# Patient Record
Sex: Female | Born: 1970
Health system: Southern US, Community
[De-identification: ages and names within clinical notes are randomized; demographics above are authoritative.]

## PROBLEM LIST (undated history)

## (undated) HISTORY — PX: SHOULDER SURGERY: SHX246

## (undated) HISTORY — PX: ABDOMINAL HYSTERECTOMY: SHX81

## (undated) HISTORY — PX: BACK SURGERY: SHX140

---

## 2003-09-12 ENCOUNTER — Emergency Department (HOSPITAL_COMMUNITY): Admission: EM | Admit: 2003-09-12 | Discharge: 2003-09-13 | Payer: Self-pay | Admitting: Emergency Medicine

## 2004-11-07 ENCOUNTER — Ambulatory Visit (HOSPITAL_BASED_OUTPATIENT_CLINIC_OR_DEPARTMENT_OTHER): Admission: RE | Admit: 2004-11-07 | Discharge: 2004-11-07 | Payer: Self-pay | Admitting: Orthopedic Surgery

## 2005-01-23 ENCOUNTER — Ambulatory Visit (HOSPITAL_BASED_OUTPATIENT_CLINIC_OR_DEPARTMENT_OTHER): Admission: RE | Admit: 2005-01-23 | Discharge: 2005-01-23 | Payer: Self-pay | Admitting: Orthopedic Surgery

## 2007-01-15 ENCOUNTER — Ambulatory Visit (HOSPITAL_COMMUNITY): Admission: RE | Admit: 2007-01-15 | Discharge: 2007-01-16 | Payer: Self-pay | Admitting: Orthopaedic Surgery

## 2007-08-09 IMAGING — RF DG LUMBAR SPINE 1V
1 series · 1 of 1 positions shown · non-contrast
Comparison: none

CLINICAL DATA: Left L3 for microdiscectomy.

Lateral intraoperative fluoroscopic spot image of the lumbar spine:

[Series 1: run · 1 of 1 slices shown]
[im 1/1]
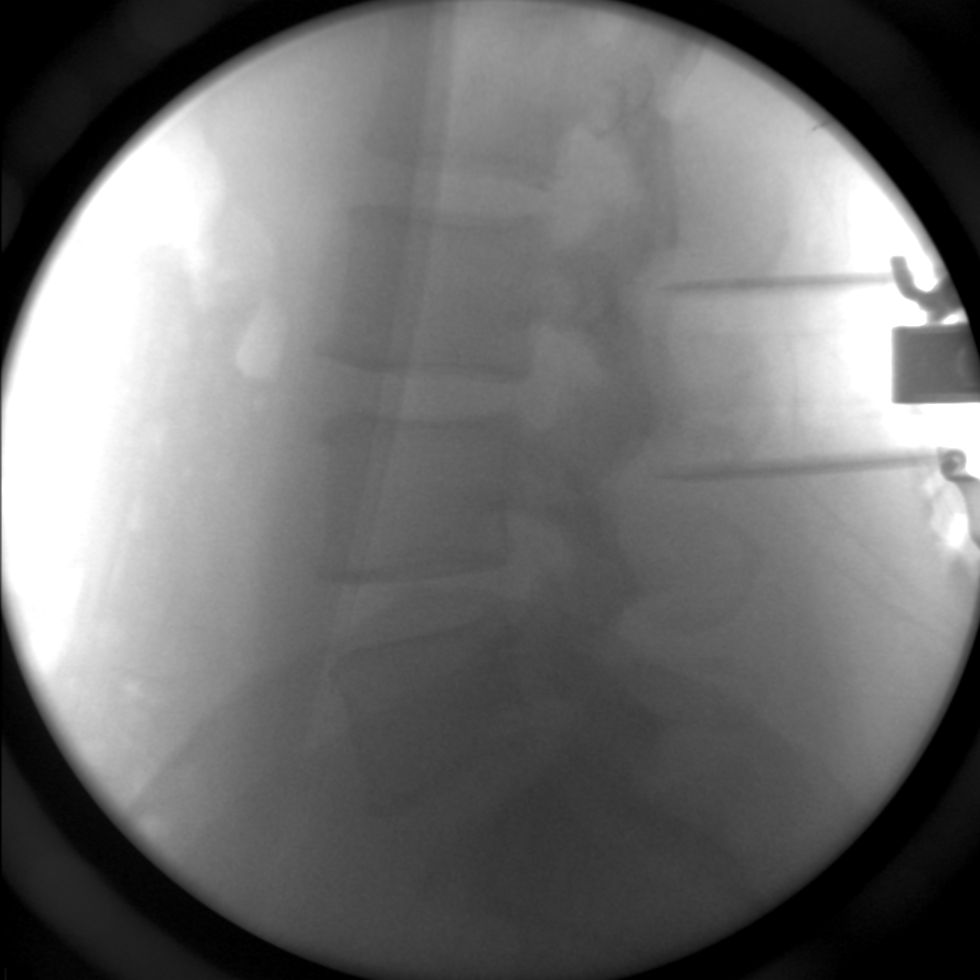

[1 of 1 positions shown; findings below may reference images not displayed]

FINDINGS: Assuming that the lowest fully segmental lumbar type nonrib-bearing
vertebra is L5, there appear to be linear probes between the L2-L3 and L3-L4
spinous processes.

Impressions:
1. Intraoperative lumbar localization.

## 2010-09-19 ENCOUNTER — Inpatient Hospital Stay (HOSPITAL_COMMUNITY)
Admission: AD | Admit: 2010-09-19 | Discharge: 2010-09-19 | Payer: Self-pay | Source: Home / Self Care | Admitting: Obstetrics & Gynecology

## 2010-10-18 ENCOUNTER — Ambulatory Visit: Payer: Self-pay | Admitting: Obstetrics and Gynecology

## 2010-10-24 ENCOUNTER — Ambulatory Visit (HOSPITAL_COMMUNITY): Admission: RE | Admit: 2010-10-24 | Payer: Self-pay | Source: Home / Self Care | Admitting: Family Medicine

## 2011-01-03 LAB — URINALYSIS, ROUTINE W REFLEX MICROSCOPIC
Bilirubin Urine: NEGATIVE
Glucose, UA: NEGATIVE mg/dL
Ketones, ur: NEGATIVE mg/dL
Nitrite: NEGATIVE
Protein, ur: NEGATIVE mg/dL
Specific Gravity, Urine: 1.025 (ref 1.005–1.030)
Urobilinogen, UA: 0.2 mg/dL (ref 0.0–1.0)
pH: 6 (ref 5.0–8.0)

## 2011-01-03 LAB — POCT PREGNANCY, URINE: Preg Test, Ur: NEGATIVE

## 2011-01-03 LAB — URINE MICROSCOPIC-ADD ON

## 2011-03-09 NOTE — Op Note (Signed)
Sherry Turner, SCOBEE NO.:  [id number]   MEDICAL RECORD NO.:  [id number]          PATIENT TYPE:  AMB   LOCATION:  DSC                          FACILITY:  [facility]   PHYSICIAN:  Cindee Salt, M.D.       DATE OF BIRTH:  13-Jun-1971   DATE OF PROCEDURE:  11/07/2004  DATE OF DISCHARGE:                                 OPERATIVE REPORT   PREOPERATIVE DIAGNOSIS:  Carpal tunnel syndrome, left hand.   POSTOPERATIVE DIAGNOSIS:  Carpal tunnel syndrome, left hand.   OPERATION:  Decompression left median nerve.   SURGEON:  Cindee Salt, M.D.   ASSISTANTCarolyne Fiscal.   ANESTHESIA:  Forearm-based IV regional.   HISTORY:  The patient is a 40 year old female with a history of carpal  tunnel syndrome and EMG nerve conductions positive, which has not responded  to conservative treatment.   PROCEDURE:  The patient is brought to the operating room, where a forearm-  based IV regional anesthetic was carried out without difficulty.  She was  prepped using DuraPrep, supine position, left arm free.  A longitudinal  incision was made in the palm, carried down through subcutaneous tissue.  Bleeders were electrocauterized.  Palmar fascia was split, superficial  palmar arch identified, the flexor tendon to the removal finger identified  to the ulnar side of the median nerve.  The carpal retinaculum was incised  with sharp dissection, right angle and Sewell retractor placed between the  skin and forearm fascia. The fascia was released for approximately 1.5 cm  proximal to the wrist crease under direct vision.  The canal was explored.  Tenosynovial synovial tissue was thickened.  The area of compression of the  nerve was apparent with an area of hyperemia.  The wound was irrigated.  Skin was closed with interrupted 5-0 nylon sutures.  A sterile compressive  dressing and splint was applied.  The patient tolerated the procedure well  was taken to the recovery room for observation in satisfactory  condition.  She is discharged home to return to the [facility] [place] in one  week on Vicodin.       GK/MEDQ  D:  11/07/2004  T:  11/07/2004  Job:  [id number]

## 2011-03-09 NOTE — H&P (Signed)
Sherry Turner, Sherry Turner NO.:  [id number]   MEDICAL RECORD NO.:  [id number]          PATIENT TYPE:  OIB   LOCATION:  5006                         FACILITY:  [facility]   PHYSICIAN:  Verlin Fester, P.A.    DATE OF BIRTH:  1970-10-27   DATE OF ADMISSION:  01/15/2007  DATE OF DISCHARGE:  01/16/2007                              HISTORY & PHYSICAL   CHIEF COMPLAINT:  Low back and left lower extremity pain.   HISTORY OF PRESENT ILLNESS:  The patient is a 40 year old female who has  had a sudden onset of low back pain that radiated into her left lower  extremity.  She was found to have an HNP at L3-4.  Secondary to this,  and her failure to improve conservatively, it was thought her best  course of management would be a microdiskectomy and left-sided L3-4.  Risks and benefits of the surgery were discussed with the patient by Dr.  Noel Gerold, as well as myself.  She indicated understanding and opted to  proceed.   ALLERGIES:  VICODIN CAUSES ITCHING.   MEDICATIONS:  1. Ibuprofen 800 mg.  2. Birth control pill.   PAST MEDICAL HISTORY:  Healthy.   PAST SURGICAL HISTORY:  Carpal tunnel release.   SOCIAL HISTORY:  The patient is married, has 2 children, ages 50 and 64.  Denies tobacco use.  Denies alcohol use.   FAMILY MEDICAL HISTORY:  Noncontributory.   REVIEW OF SYSTEMS:  Negative.   PHYSICAL EXAMINATION:  GENERAL APPEARANCE:  The patient is a 40 year old  black female who is alert and oriented in no acute distress.  She is  well-nourished, well-groomed.  Appears stated age.  Pleasant and  cooperative to exam.  HEENT:  Head is normocephalic, atraumatic.  Pupils equal, round,  reactive to light.  Extraocular movements are intact.  Nares patent.  Pharynx clear.  NECK:  Soft to palpation.  No lymphadenopathy or thyromegaly  appreciated.  CHEST:  Clear to auscultation bilaterally.  No rales, rhonchi, stridor,  wheezes, or friction rubs.  BREASTS:  Not pertinent, not  performed.  HEART:  S1 and S2, regular rate and rhythm, no murmurs, gallops, or rubs  noted.  ABDOMEN:  Soft to palpation, nontender, nondistended, no organomegaly  noted.  Positive bowel sounds throughout.  GU:  Not pertinent, not performed.  EXTREMITIES:  As per HPI.  SKIN:  Intact without any lesions or rashes.   IMPRESSION:  Herniated nucleus pulposus L3-4 on the left.   PLAN:  Admit to [facility] on January 15, 2007, for a left-sided  L3-4 microdiskectomy.  Surgery will be done by Dr. Noel Gerold.      Verlin Fester, P.A.     CM/MEDQ  D:  01/29/2007  T:  01/29/2007  Job:  [id number]

## 2011-03-09 NOTE — Op Note (Signed)
[name], [name] NO.:  [id number]   MEDICAL RECORD NO.:  [id number]          PATIENT TYPE:  AMB   LOCATION:  DSC                          FACILITY:  [facility]   PHYSICIAN:  Cindee Salt, M.D.       DATE OF BIRTH:  12/15/1970   DATE OF PROCEDURE:  01/23/2005  DATE OF DISCHARGE:                                 OPERATIVE REPORT   PREOPERATIVE DIAGNOSIS:  Carpal tunnel syndrome right hand.   POSTOPERATIVE DIAGNOSIS:  Carpal tunnel syndrome right hand.   OPERATION:  Decompression right median nerve.   SURGEON:  Cindee Salt, M.D.   ASSISTANT:  Imam.   ANESTHESIA PERFORMED:  Basal IV regional.   HISTORY:  The patient is a 40 year old female with history of carpal tunnel  syndrome.  EMG nerve conduction was positive which has not responded to  conservative treatment.   PROCEDURE:  The patient was brought to the operating room where a forearm  based IV regional anesthetic was carried out without difficulty. She was  prepped using DuraPrep in the supine position, right arm free. Longitudinal  incision was made, in the palm and carried down through subcutaneous tissue.  Bleeders were electrocauterized. Palmar fascia was split, superficial palmar  arch identified.  The flexor tendon in the ring and little finger  identified.  To the ulnar side of median nerve the carpal retinaculum was  incised with sharp dissection. A right angle and Sewell retractor placed  between skin and forearm fascia. Fascia was released for approximately 1.5  cm proximal to the wrist crease under direct vision.  Canal was explored. No  further lesions were identified and an area of compression to the nerve  apparent.  The wound was irrigated. The skin was closed with interrupted 5-0  nylon sutures. Sterile compressive dressing and splint to the hand-wrist was  applied. The patient tolerated the procedure well and was taken to the  recovery room for observation, in satisfactory condition. She is  discharged  to home to return to the [facility] [place] in 1 week on Talwin NX.     GK/MEDQ  D:  01/23/2005  T:  01/23/2005  Job:  [id number]   cc:   Cindee Salt, M.D.  [address]  [place]  [place] [address]  Fax: [phone]

## 2011-03-09 NOTE — Op Note (Signed)
NAME:  Sherry Turner, Sherry Turner NO.:  [id number]   MEDICAL RECORD NO.:  [id number]          PATIENT TYPE:  OIB   LOCATION:  5006                         FACILITY:  [facility]   PHYSICIAN:  Sharolyn Douglas, M.D.        DATE OF BIRTH:  Nov 29, 1970   DATE OF PROCEDURE:  01/16/2007  DATE OF DISCHARGE:                               OPERATIVE REPORT   DIAGNOSIS:  Lateral recess stenosis and herniated nucleus pulposus left  L3-4.   PROCEDURE:  Left L3-4 microlaminotomy and diskectomy.   SURGEON:  Sharolyn Douglas, M.D.   ASSISTANT:  Jill Side Mahar, PA   ANESTHESIA:  General endotracheal   ESTIMATED BLOOD LOSS:  Minimal.   COMPLICATIONS:  None.   INDICATIONS:  The patient is a  pleasant female with persistent back and  left lower extremity pain thought to be secondary to disk herniation and  lateral recess narrowing at L3-4 on the left.  She has failed other  conservative treatments and now presents for L3-4 micro decompression.  The risks, benefits, and alternatives were reviewed.   PROCEDURE:  After informed consent she was taken to the operating room  where she underwent general endotracheal anesthesia without difficulty.  She was given prophylactic IV antibiotics.  Carefully positioned prone  on the Wilson frame.  All bony prominences padded.  Face and eyes  protected at all times.  Back prepped and draped in the usual sterile  fashion.  Fluoroscopy was brought into the field.  The L3-4 level was  identified.  A 2-cm incision was made in the midline.  Dissection was  carried through the deep fascia sharply.  Dilators were utilized from  the [employer] retractor system to dock onto the L3-4 interspace on the  left which was confirmed with fluoroscopy.  The MaXcess retractor was  placed over the final dilator with the lid save 2 mm blade and attached  to the operating room table using the attachment arm.  The retractor was  gently spread dilating the paraspinal muscles.  Further  fluoroscopic  imaging confirmed the retractor placed over the L3-4 interspace.  We  then brought the microscope into the operating room field.  High-speed  bur was used to remove the inferior one third of the L3 lamina, medial  one third of the facet joint complex.  The ligamentum flavum was removed  piecemeal.  The L4 nerve root was identified, mobilized medial.  A focal  disk herniation was identified which was displacing the nerve root.  The  disk space was entered using a 15 blade, and a subligamentous disk  herniation was removed which immediately decompressed the nerve root.  The spinal canal was explored, and there did not appear to be any  further fragments.  Several passes were made with the pituitary,  removing additional loose fragments from within the interspace.  The  wound was irrigated.  Hemostasis was achieved.  Two mL of fentanyl left  in the exposed epidural space for postoperative analgesia.  The superior  facet was undercut further decompressing the lateral recess.  The deep  fascia was  closed with 0 Vicryl suture.  Subcutaneous layer was closed  with 2-0 Vicryl suture followed by a running 3-0 subcuticular Vicryl  suture on the skin edges.  Dermabond was applied.  The patient was  turned supine, extubated without difficulty, and transferred to recovery  in stable condition, able to move her upper and lower extremities.   It should be noted my assistant, Verlin Fester, PA was present  throughout the procedure including positioning, exposure, decompression,  and she also assisted with wound closure.      Sharolyn Douglas, M.D.  Electronically Signed     MC/MEDQ  D:  01/16/2007  T:  01/16/2007  Job:  [id number]

## 2016-02-22 ENCOUNTER — Emergency Department (HOSPITAL_BASED_OUTPATIENT_CLINIC_OR_DEPARTMENT_OTHER)
Admission: EM | Admit: 2016-02-22 | Discharge: 2016-02-22 | Disposition: A | Discharge status: Home / Self Care | Payer: BC Managed Care – PPO | Attending: Emergency Medicine | Admitting: Emergency Medicine

## 2016-02-22 ENCOUNTER — Encounter (HOSPITAL_BASED_OUTPATIENT_CLINIC_OR_DEPARTMENT_OTHER): Payer: Self-pay

## 2016-02-22 DIAGNOSIS — R509 Fever, unspecified: Secondary | ICD-10-CM | POA: Diagnosis not present

## 2016-02-22 DIAGNOSIS — R0981 Nasal congestion: Secondary | ICD-10-CM | POA: Diagnosis not present

## 2016-02-22 DIAGNOSIS — J111 Influenza due to unidentified influenza virus with other respiratory manifestations: Secondary | ICD-10-CM

## 2016-02-22 DIAGNOSIS — R05 Cough: Secondary | ICD-10-CM | POA: Diagnosis not present

## 2016-02-22 DIAGNOSIS — R51 Headache: Secondary | ICD-10-CM | POA: Insufficient documentation

## 2016-02-22 DIAGNOSIS — R69 Illness, unspecified: Secondary | ICD-10-CM

## 2016-02-22 DIAGNOSIS — R52 Pain, unspecified: Secondary | ICD-10-CM | POA: Diagnosis present

## 2016-02-22 MED ORDER — FLUTICASONE PROPIONATE 50 MCG/ACT NA SUSP: 2.0000 | Freq: Every day | NASAL | Status: AC

## 2016-02-22 MED ORDER — CETIRIZINE HCL 10 MG PO TABS: 10.0000 mg | ORAL_TABLET | Freq: Every day | ORAL | Status: AC

## 2016-02-22 MED ORDER — IBUPROFEN 800 MG PO TABS
800.0000 mg | ORAL_TABLET | Freq: Once | ORAL | Status: AC
  Administered 2016-02-22: 800 mg via ORAL
  Filled 2016-02-22: qty 1

## 2016-02-22 MED ORDER — NAPROXEN 250 MG PO TABS: 250.0000 mg | ORAL_TABLET | Freq: Two times a day (BID) | ORAL | Status: AC

## 2016-02-22 MED ORDER — BENZONATATE 100 MG PO CAPS: 100.0000 mg | ORAL_CAPSULE | Freq: Three times a day (TID) | ORAL | Status: AC | PRN

## 2016-02-22 MED FILL — NAPROXEN 250 MG TABLET: 250 | 15 days supply | Qty: 30 | Fill #0

## 2016-02-22 MED FILL — ALL DAY ALLERGY 10 MG TAB: 10 | 100 days supply | Qty: 100 | Fill #0

## 2016-02-22 MED FILL — FLUTICASONE PROP 50 MCG SPR: 50 | 30 days supply | Qty: 16 | Fill #0

## 2016-02-22 MED FILL — BENZONATATE 100 MG CAPSULE: 100 | 7 days supply | Qty: 21 | Fill #0

## 2016-02-22 NOTE — ED Provider Notes (Signed)
CSN: [id number]     Arrival date & time 02/22/16  1154 History   First MD Initiated Contact with Patient 02/22/16 1217     Chief Complaint  Patient presents with  . Generalized Body Aches   Sherry Turner is a 45 y.o. female who presents to the emergency department complaining of sneezing, nasal congestion, postnasal drip, cough, subjective fever and body aches since yesterday. Patient reports subjective fever at home. She is taking Alka-Seltzer with little relief. No antipyretics today. She did not take her flu shot this year. She also complains of generalized headache. No neck pain or neck stiffness. She denies shortness of breath, wheezing, abdominal pain, nausea, vomiting, diarrhea, rashes or urinary symptoms.  The history is provided by the patient. No language interpreter was used.    History reviewed. No pertinent past medical history. Past Surgical History  Procedure Laterality Date  . Shoulder surgery    . Back surgery    . Abdominal hysterectomy     No family history on file. Social History  Substance Use Topics  . Smoking status: Never Smoker   . Smokeless tobacco: None  . Alcohol Use: No   OB History    No data available     Review of Systems  Constitutional: Positive for fever and fatigue.  HENT: Positive for congestion, postnasal drip, rhinorrhea, sinus pressure and sneezing. Negative for ear pain, facial swelling, mouth sores and trouble swallowing.   Eyes: Negative for visual disturbance.  Respiratory: Positive for cough. Negative for chest tightness, shortness of breath and wheezing.   Cardiovascular: Negative for chest pain.  Gastrointestinal: Negative for nausea, vomiting, abdominal pain and diarrhea.  Genitourinary: Negative for dysuria, urgency, frequency and hematuria.  Musculoskeletal: Positive for myalgias. Negative for back pain, neck pain and neck stiffness.  Skin: Negative for rash.  Neurological: Positive for headaches. Negative for dizziness, syncope,  weakness, light-headedness and numbness.      Allergies  Vicodin  Home Medications   Prior to Admission medications   Medication Sig Start Date End Date Taking? Authorizing Provider  benzonatate (TESSALON) 100 MG capsule Take 1 capsule (100 mg total) by mouth 3 (three) times daily as needed for cough. 02/22/16   Everlene Farrier, PA-C  cetirizine (ZYRTEC ALLERGY) 10 MG tablet Take 1 tablet (10 mg total) by mouth daily. 02/22/16   Everlene Farrier, PA-C  fluticasone (FLONASE) 50 MCG/ACT nasal spray Place 2 sprays into both nostrils daily. 02/22/16   Everlene Farrier, PA-C  naproxen (NAPROSYN) 250 MG tablet Take 1 tablet (250 mg total) by mouth 2 (two) times daily with a meal. 02/22/16   Everlene Farrier, PA-C   BP 101/71 mmHg  Pulse 63  Temp(Src) 99 F (37.2 C) (Oral)  Resp 16  Ht  (1.6 m)  Wt 77.111 kg  BMI 30.12 kg/m2  SpO2 100% Physical Exam  Constitutional: She appears well-developed and well-nourished. No distress.  Nontoxic-appearing.  HENT:  Head: Normocephalic and atraumatic.  Right Ear: External ear normal.  Left Ear: External ear normal.  Mouth/Throat: Oropharynx is clear and moist.  Mild bilateral tonsillar hypertrophy without exudates. Uvula is midline without edema. No peritonsillar abscess. No trismus. No drooling. Tongue protrusion is normal.  Bilateral tympanic membranes are pearly-gray without erythema or loss of landmarks.  Boggy nasal turbinates bilaterally.  Eyes: Conjunctivae are normal. Pupils are equal, round, and reactive to light. Right eye exhibits no discharge. Left eye exhibits no discharge.  Neck: Normal range of motion. Neck supple. No JVD present.  No tracheal deviation present.  No meningeal signs. Good range of motion of her neck.  Cardiovascular: Normal rate, regular rhythm, normal heart sounds and intact distal pulses.  Exam reveals no gallop and no friction rub.   No murmur heard. Pulmonary/Chest: Effort normal and breath sounds normal. No stridor. No  respiratory distress. She has no wheezes. She has no rales.  Lungs are clear to auscultation bilaterally. No increased work of breathing. No rales or rhonchi. No wheezing.  Abdominal: Soft. There is no tenderness. There is no guarding.  Musculoskeletal: She exhibits no edema.  Lymphadenopathy:    She has no cervical adenopathy.  Neurological: She is alert. Coordination normal.  Skin: Skin is warm and dry. No rash noted. She is not diaphoretic. No erythema. No pallor.  Psychiatric: She has a normal mood and affect. Her behavior is normal.  Nursing note and vitals reviewed.   ED Course  Procedures (including critical care time) Labs Review Labs Reviewed - No data to display  Imaging Review No results found. .   EKG Interpretation None      Filed Vitals:   02/22/16 1207  BP: 101/71  Pulse: 63  Temp: 99 F (37.2 C)  TempSrc: Oral  Resp: 16  Height: 5\' 3"  (1.6 m)  Weight: 77.111 kg  SpO2: 100%     MDM   Meds given in ED:  Medications  ibuprofen (ADVIL,MOTRIN) tablet 800 mg (800 mg Oral Given 02/22/16 1306)    New Prescriptions   BENZONATATE (TESSALON) 100 MG CAPSULE    Take 1 capsule (100 mg total) by mouth 3 (three) times daily as needed for cough.   CETIRIZINE (ZYRTEC ALLERGY) 10 MG TABLET    Take 1 tablet (10 mg total) by mouth daily.   FLUTICASONE (FLONASE) 50 MCG/ACT NASAL SPRAY    Place 2 sprays into both nostrils daily.   NAPROXEN (NAPROSYN) 250 MG TABLET    Take 1 tablet (250 mg total) by mouth 2 (two) times daily with a meal.    Final diagnoses:  Influenza-like illness    This is a 45 y.o. female who presents to the emergency department complaining of sneezing, nasal congestion, postnasal drip, cough, subjective fever and body aches since yesterday. Patient reports subjective fever at home. She is taking Alka-Seltzer with little relief. No antipyretics today. She did not take her flu shot this year. She also complains of generalized headache. No neck pain or  neck stiffness.  On exam patient is afebrile nontoxic appearing. Her lungs are clear to auscultation bilaterally. Throat is clear. She has boggy nasal terminates bilaterally. With the patient having subjective fever, body aches, URI symptoms and she did not receive her flu shot, I suspect influenza-like illness.  Will discharge with Tessalon Perles, Zyrtec, Flonase and naproxen. I encouraged close follow-up by primary care and I discussed strict and specific return precautions. I advised the patient to follow-up with their primary care provider this week. I advised the patient to return to the emergency department with new or worsening symptoms or new concerns. The patient verbalized understanding and agreement with plan.    Everlene FarrierWilliam Natoshia Souter, PA-C 02/22/16 1335  Marily MemosJason Mesner, MD 02/23/16 1754

## 2016-02-22 NOTE — Discharge Instructions (Signed)

## 2016-02-22 NOTE — ED Notes (Signed)
C/o body aches, cough x 2 days-NAD-steady gait
